# Patient Record
Sex: Male | Born: 1955 | Race: White | Hispanic: Yes | Marital: Married | Smoking: Never smoker
Health system: Southern US, Community
[De-identification: ages and names within clinical notes are randomized; demographics above are authoritative.]

## PROBLEM LIST (undated history)

## (undated) DIAGNOSIS — I10 Essential (primary) hypertension: Secondary | ICD-10-CM

## (undated) DIAGNOSIS — E785 Hyperlipidemia, unspecified: Secondary | ICD-10-CM

## (undated) HISTORY — PX: NO PAST SURGERIES: SHX2092

---

## 2009-01-14 ENCOUNTER — Ambulatory Visit: Payer: Self-pay | Admitting: Gastroenterology

## 2011-03-06 ENCOUNTER — Emergency Department: Payer: Self-pay | Admitting: Emergency Medicine

## 2016-03-22 ENCOUNTER — Other Ambulatory Visit (INDEPENDENT_AMBULATORY_CARE_PROVIDER_SITE_OTHER): Payer: Self-pay | Admitting: Vascular Surgery

## 2016-03-22 DIAGNOSIS — M79662 Pain in left lower leg: Secondary | ICD-10-CM

## 2016-03-22 DIAGNOSIS — M7989 Other specified soft tissue disorders: Secondary | ICD-10-CM

## 2016-03-22 DIAGNOSIS — M79661 Pain in right lower leg: Secondary | ICD-10-CM

## 2016-03-22 DIAGNOSIS — I83813 Varicose veins of bilateral lower extremities with pain: Secondary | ICD-10-CM

## 2016-03-23 ENCOUNTER — Ambulatory Visit (INDEPENDENT_AMBULATORY_CARE_PROVIDER_SITE_OTHER): Payer: 59

## 2016-03-23 DIAGNOSIS — M7989 Other specified soft tissue disorders: Secondary | ICD-10-CM | POA: Diagnosis not present

## 2016-03-23 DIAGNOSIS — I83813 Varicose veins of bilateral lower extremities with pain: Secondary | ICD-10-CM

## 2016-03-23 DIAGNOSIS — M79662 Pain in left lower leg: Secondary | ICD-10-CM | POA: Diagnosis not present

## 2016-03-23 DIAGNOSIS — M79661 Pain in right lower leg: Secondary | ICD-10-CM

## 2016-03-28 ENCOUNTER — Telehealth (INDEPENDENT_AMBULATORY_CARE_PROVIDER_SITE_OTHER): Payer: Self-pay | Admitting: Vascular Surgery

## 2016-03-28 NOTE — Telephone Encounter (Signed)
Second attempt to call patient and discuss his 03/23/16 duplex results. Left message again.

## 2017-05-27 ENCOUNTER — Emergency Department
Admission: EM | Admit: 2017-05-27 | Discharge: 2017-05-27 | Disposition: A | Discharge status: Home / Self Care | Payer: BLUE CROSS/BLUE SHIELD | Attending: Emergency Medicine | Admitting: Emergency Medicine

## 2017-05-27 DIAGNOSIS — I10 Essential (primary) hypertension: Secondary | ICD-10-CM | POA: Insufficient documentation

## 2017-05-27 DIAGNOSIS — I83892 Varicose veins of left lower extremities with other complications: Secondary | ICD-10-CM | POA: Diagnosis not present

## 2017-05-27 DIAGNOSIS — Z79899 Other long term (current) drug therapy: Secondary | ICD-10-CM | POA: Insufficient documentation

## 2017-05-27 HISTORY — DX: Essential (primary) hypertension: I10

## 2017-05-27 NOTE — ED Triage Notes (Signed)
Pt reports that varicose vein in L foot "popped" and bled for approx 2 hours.  Bleeding controlled at this time.  Pt wrapped his own foot.  History of the same.  Pt is A&Ox4, in NAD at this time.

## 2017-05-27 NOTE — ED Provider Notes (Signed)
[facility] Emergency Department Provider Note  ____________________________________________  Time seen: Approximately 9:51 PM  I have reviewed the triage vital signs and the nursing notes.   HISTORY  Chief Complaint Leg Injury (busted varicose vein in L foot)    HPI Douglas Mullins is a 62 y.o. male who presents the emergency department complaining of bleeding varicose vein to the left foot.  Patient reports that he has significant varicose veins to the left lower extremity and moderate varicose veins to the right lower extremity.  Patient is unsure how the bleeding started but states that he had difficulty controlling the bleeding for approximately 2 hours.  Patient states that the bleeding is now well controlled and he has the laceration bandage prior to arrival.  Patient states that he was seen by vascular surgery 1-2 years ago but did not follow-up.  Patient denies any calf pain, swelling, numbness and tingling of the foot.  Patient states that spontaneous bleeding from varicose veins has happened once in the past.  That is a remote history.  No medications for this complaint prior to arrival.  No other injuries or complaints at this time.  Past Medical History:  Diagnosis Date  . Hypertension     There are no active problems to display for this patient.   History reviewed. No pertinent surgical history.  Prior to Admission medications   Medication Sig Start Date End Date Taking? Authorizing Provider  lisinopril (PRINIVIL,ZESTRIL) 10 MG tablet Take 10 mg by mouth daily.   Yes [provider]    Allergies Patient has no known allergies.  No family history on file.  Social History Social History   Tobacco Use  . Smoking status: Never Smoker  Substance Use Topics  . Alcohol use: Yes    Comment: occasional  . Drug use: No     Review of Systems  Constitutional: No fever/chills Cardiovascular: no chest pain. Respiratory: no cough. No  SOB. Gastrointestinal: No abdominal pain.  No nausea, no vomiting.   Musculoskeletal: Negative for musculoskeletal pain. Skin: Negative for rash, abrasions, lacerations, ecchymosis.  Positive for bleeding varicose vein to the left medial foot Neurological: Negative for headaches, focal weakness or numbness. 10-point ROS otherwise negative.  ____________________________________________   PHYSICAL EXAM:  VITAL SIGNS: ED Triage Vitals [05/27/17 2130]  Enc Vitals Group     BP (!) 181/85     Pulse Rate 93     Resp 18     Temp 97.9 F (36.6 C)     Temp Source Oral     SpO2 97 %     Weight 179 lb (81.2 kg)     Height      Head Circumference      Peak Flow      Pain Score 8     Pain Loc      Pain Edu?      Excl. in GC?      Constitutional: Alert and oriented. Well appearing and in no acute distress. Eyes: Conjunctivae are normal. PERRL. EOMI. Head: Atraumatic. Neck: No stridor.    Cardiovascular: Normal rate, regular rhythm. Normal S1 and S2.  Good peripheral circulation. Respiratory: Normal respiratory effort without tachypnea or retractions. Lungs CTAB. Good air entry to the bases with no decreased or absent breath sounds. Musculoskeletal: Full range of motion to all extremities. No gross deformities appreciated. Neurologic:  Normal speech and language. No gross focal neurologic deficits are appreciated.  Skin:  Skin is warm, dry and intact. No  rash noted.  Small wound noted to the left medial foot consistent with a bleeding varicose vein.  At this time, no bleeding.  Area was dressed with no bleeding through the bandage.  Dorsalis pedis pulse intact.  Sensation intact all 5 feet.  Patient has significant varicose veins to this left lower extremity. Psychiatric: Mood and affect are normal. Speech and behavior are normal. Patient exhibits appropriate insight and judgement.   ____________________________________________   LABS (all labs ordered are listed, but only abnormal  results are displayed)  Labs Reviewed - No data to display ____________________________________________  EKG   ____________________________________________  RADIOLOGY   No results found.  ____________________________________________    PROCEDURES  Procedure(s) performed:    Marland KitchenMarland KitchenLaceration Repair Date/Time: 05/27/2017 10:15 PM Performed by: Racheal Patches, PA-C Authorized by: Racheal Patches, PA-C   Consent:    Consent obtained:  Verbal   Consent given by:  Patient   Risks discussed:  Poor wound healing Anesthesia (see MAR for exact dosages):    Anesthesia method:  None Laceration details:    Location:  Foot   Foot location:  Top of L foot Exploration:    Hemostasis achieved with:  Direct pressure   Wound exploration: entire depth of wound probed and visualized     Wound extent: no foreign bodies/material noted and no vascular damage noted     Contaminated: no   Treatment:    Area cleansed with:  Shur-Clens   Amount of cleaning:  Standard   Irrigation solution:  Sterile saline Skin repair:    Repair method:  Tissue adhesive Post-procedure details:    Dressing:  Bulky dressing   Patient tolerance of procedure:  Tolerated well, no immediate complications      Medications - No data to display   ____________________________________________   INITIAL IMPRESSION / ASSESSMENT AND PLAN / ED COURSE  Pertinent labs & imaging results that were available during my care of the patient were reviewed by me and considered in my medical decision making (see chart for details).  Review of the [place] CSRS was performed in accordance of the [facility] prior to dispensing any controlled drugs.     Patient's diagnosis is consistent with bleeding varicose vein to the left foot.  Bleeding had stopped on arrival.  At this time, area was thoroughly cleansed, covered in Dermabond, pressure dressing applied.  Wound care instructions provided to patient.  Patient is to  follow-up with vascular surgery for further management of significant varicose veins to the left lower extremity.  At this time, no indication for further workup.  No prescriptions at this time.   Patient is given ED precautions to return to the ED for any worsening or new symptoms.     ____________________________________________  FINAL CLINICAL IMPRESSION(S) / ED DIAGNOSES  Final diagnoses:  Bleeding from varicose veins of left lower extremity      NEW MEDICATIONS STARTED DURING THIS VISIT:  ED Discharge Orders    None          This chart was dictated using voice recognition software/Dragon. Despite best efforts to proofread, errors can occur which can change the meaning. Any change was purely unintentional.    Racheal Patches, PA-C 05/27/17 2218    Sharman Cheek, MD 05/27/17 [phone]

## 2017-06-12 ENCOUNTER — Encounter (INDEPENDENT_AMBULATORY_CARE_PROVIDER_SITE_OTHER): Payer: Self-pay | Admitting: Vascular Surgery

## 2017-06-12 ENCOUNTER — Ambulatory Visit (INDEPENDENT_AMBULATORY_CARE_PROVIDER_SITE_OTHER): Payer: BLUE CROSS/BLUE SHIELD | Admitting: Vascular Surgery

## 2017-06-12 DIAGNOSIS — M79605 Pain in left leg: Secondary | ICD-10-CM | POA: Diagnosis not present

## 2017-06-12 DIAGNOSIS — I872 Venous insufficiency (chronic) (peripheral): Secondary | ICD-10-CM

## 2017-06-12 DIAGNOSIS — I83813 Varicose veins of bilateral lower extremities with pain: Secondary | ICD-10-CM | POA: Diagnosis not present

## 2017-06-12 DIAGNOSIS — M79604 Pain in right leg: Secondary | ICD-10-CM

## 2017-06-13 ENCOUNTER — Ambulatory Visit (INDEPENDENT_AMBULATORY_CARE_PROVIDER_SITE_OTHER): Payer: BLUE CROSS/BLUE SHIELD

## 2017-06-13 ENCOUNTER — Other Ambulatory Visit (INDEPENDENT_AMBULATORY_CARE_PROVIDER_SITE_OTHER): Payer: Self-pay | Admitting: Vascular Surgery

## 2017-06-13 DIAGNOSIS — I83892 Varicose veins of left lower extremities with other complications: Secondary | ICD-10-CM

## 2017-06-18 ENCOUNTER — Encounter (INDEPENDENT_AMBULATORY_CARE_PROVIDER_SITE_OTHER): Payer: Self-pay | Admitting: Vascular Surgery

## 2017-06-18 DIAGNOSIS — I872 Venous insufficiency (chronic) (peripheral): Secondary | ICD-10-CM | POA: Insufficient documentation

## 2017-06-18 DIAGNOSIS — M79606 Pain in leg, unspecified: Secondary | ICD-10-CM | POA: Insufficient documentation

## 2017-06-18 DIAGNOSIS — I83813 Varicose veins of bilateral lower extremities with pain: Secondary | ICD-10-CM | POA: Insufficient documentation

## 2017-06-18 NOTE — Progress Notes (Signed)
MRN : [id number]  Douglas Mullins is a 62 y.o. (10/03/1955) male who presents with chief complaint of  Chief Complaint  Patient presents with  . Follow-up  .  History of Present Illness: The patient returns for followup evaluation 3 months after the initial visit. The patient continues to have pain in the lower extremities with dependency. The pain is lessened with elevation. Graduated compression stockings, Class I (20-30 mmHg), have been worn but the stockings do not eliminate the leg pain. Over-the-counter analgesics do not improve the symptoms. The degree of discomfort continues to interfere with daily activities. The patient notes the pain in the legs is causing problems with daily exercise, at the workplace and even with household activities and maintenance such as standing in the kitchen preparing meals and doing dishes.   Past Venous ultrasound shows normal deep venous system, no evidence of acute or chronic DVT.  Superficial reflux is present in the bilateral GSV  Current Meds  Medication Sig  . lisinopril (PRINIVIL,ZESTRIL) 10 MG tablet Take 10 mg by mouth daily.    Past Medical History:  Diagnosis Date  . Hypertension     Past Surgical History:  Procedure Laterality Date  . NO PAST SURGERIES      Social History Social History   Tobacco Use  . Smoking status: Never Smoker  . Smokeless tobacco: Never Used  Substance Use Topics  . Alcohol use: Yes    Comment: occasional  . Drug use: No    Family History Family History  Problem Relation Age of Onset  . Varicose Veins Sister     No Known Allergies   REVIEW OF SYSTEMS (Negative unless checked)  Constitutional: [] Weight loss  [] Fever  [] Chills Cardiac: [] Chest pain   [] Chest pressure   [] Palpitations   [] Shortness of breath when laying flat   [] Shortness of breath with exertion. Vascular:  [] Pain in legs with walking   [x] Pain in legs at rest  [] History of DVT   [] Phlebitis   [x] Swelling in legs   [x] Varicose  veins   [] Non-healing ulcers Pulmonary:   [] Uses home oxygen   [] Productive cough   [] Hemoptysis   [] Wheeze  [] COPD   [] Asthma Neurologic:  [] Dizziness   [] Seizures   [] History of stroke   [] History of TIA  [] Aphasia   [] Vissual changes   [] Weakness or numbness in arm   [] Weakness or numbness in leg Musculoskeletal:   [] Joint swelling   [] Joint pain   [] Low back pain Hematologic:  [] Easy bruising  [] Easy bleeding   [] Hypercoagulable state   [] Anemic Gastrointestinal:  [] Diarrhea   [] Vomiting  [] Gastroesophageal reflux/heartburn   [] Difficulty swallowing. Genitourinary:  [] Chronic kidney disease   [] Difficult urination  [] Frequent urination   [] Blood in urine Skin:  [] Rashes   [] Ulcers  Psychological:  [] History of anxiety   []  History of major depression.  Physical Examination  Vitals:   06/12/17 1034  BP: (!) 144/81  Pulse: 70  Resp: 16  Weight: 192 lb 12.8 oz (87.5 kg)  Height: 5' 6.5" (1.689 m)   Body mass index is 30.65 kg/m. Gen: WD/WN, NAD Head: [place]/AT, No temporalis wasting.  Ear/Nose/Throat: Hearing grossly intact, nares w/o erythema or drainage Eyes: PER, EOMI, sclera nonicteric.  Neck: Supple, no large masses.   Pulmonary:  Good air movement, no audible wheezing bilaterally, no use of accessory muscles.  Cardiac: RRR, no JVD Vascular: Large varicosities present extensively greater than 10 mm bilateral.  Mild venous stasis changes to the legs bilaterally.  2+ soft pitting edema Vessel Right Left  Radial Palpable Palpable  PT Palpable Palpable  DP Palpable Palpable  Gastrointestinal: Non-distended. No guarding/no peritoneal signs.  Musculoskeletal: M/S 5/5 throughout.  No deformity or atrophy.  Neurologic: CN 2-12 intact. Symmetrical.  Speech is fluent. Motor exam as listed above. Psychiatric: Judgment intact, Mood & affect appropriate for pt's clinical situation. Dermatologic: Venous rashes no ulcers noted.  No changes consistent with cellulitis. Lymph : No  lichenification or skin changes of chronic lymphedema.  CBC No results found for: WBC, HGB, HCT, MCV, PLT  BMET No results found for: NA, K, CL, CO2, GLUCOSE, BUN, CREATININE, CALCIUM, GFRNONAA, GFRAA CrCl cannot be calculated (No order found.).  COAG No results found for: INR, PROTIME  Radiology No results found.  Assessment/Plan 1. Varicose veins of both lower extremities with pain  Recommend:  The patient has large symptomatic varicose veins that are painful and associated with swelling.  I have had a long discussion with the patient regarding  varicose veins and why they cause symptoms.  Patient will begin wearing graduated compression stockings class 1 on a daily basis, beginning first thing in the morning and removing them in the evening. The patient is instructed specifically not to sleep in the stockings.    The patient  will also begin using over-the-counter analgesics such as Motrin 600 mg po TID to help control the symptoms.    In addition, behavioral modification including elevation during the day will be initiated.    Pending the results of these changes the  patient will be reevaluated in three months.   An  ultrasound of the venous system will be obtained.   Further plans will be based on the ultrasound results and whether conservative therapies are successful at eliminating the pain and swelling.   - VAS [place] LOWER EXTREMITY VENOUS REFLUX; Future  2. Chronic venous insufficiency No surgery or intervention at this point in time.    I have had a long discussion with the patient regarding venous insufficiency and why it  causes symptoms. I have discussed with the patient the chronic skin changes that accompany venous insufficiency and the long term sequela such as infection and ulceration.  Patient will begin wearing graduated compression stockings class 1 (20-30 mmHg) or compression wraps on a daily basis a prescription was given. The patient will put the stockings on  first thing in the morning and removing them in the evening. The patient is instructed specifically not to sleep in the stockings.    In addition, behavioral modification including several periods of elevation of the lower extremities during the day will be continued. I have demonstrated that proper elevation is a position with the ankles at heart level.  The patient is instructed to begin routine exercise, especially walking on a daily basis  Patient should undergo duplex ultrasound of the venous system to ensure that DVT or reflux is not present.  Following the review of the ultrasound the patient will follow up in 2-3 months to reassess the degree of swelling and the control that graduated compression stockings or compression wraps  is offering.   The patient can be assessed for a Lymph Pump at that time  3. Pain in both lower extremities See #1&2    Levora Dredge, MD  06/18/2017 8:56 PM

## 2017-08-21 ENCOUNTER — Telehealth (INDEPENDENT_AMBULATORY_CARE_PROVIDER_SITE_OTHER): Payer: Self-pay | Admitting: Vascular Surgery

## 2021-03-25 ENCOUNTER — Ambulatory Visit
Admission: RE | Admit: 2021-03-25 | Discharge: 2021-03-25 | Disposition: A | Discharge status: Home / Self Care | Payer: BC Managed Care – PPO | Source: Ambulatory Visit | Attending: Family Medicine | Admitting: Family Medicine

## 2021-03-25 ENCOUNTER — Other Ambulatory Visit: Payer: Self-pay

## 2021-03-25 ENCOUNTER — Ambulatory Visit
Admission: RE | Admit: 2021-03-25 | Discharge: 2021-03-25 | Disposition: A | Discharge status: Home / Self Care | Payer: BC Managed Care – PPO | Attending: Family Medicine | Admitting: Family Medicine

## 2021-03-25 ENCOUNTER — Other Ambulatory Visit: Payer: Self-pay | Admitting: Family Medicine

## 2021-03-25 DIAGNOSIS — R52 Pain, unspecified: Secondary | ICD-10-CM

## 2022-09-02 IMAGING — CR DG KNEE 3 VIEWS*L*
3 series · 3 of 3 positions shown · non-contrast
Comparison: None.

CLINICAL DATA: Acute left knee pain without known injury.

EXAM:
LEFT KNEE - 3 VIEW

[knee ap]
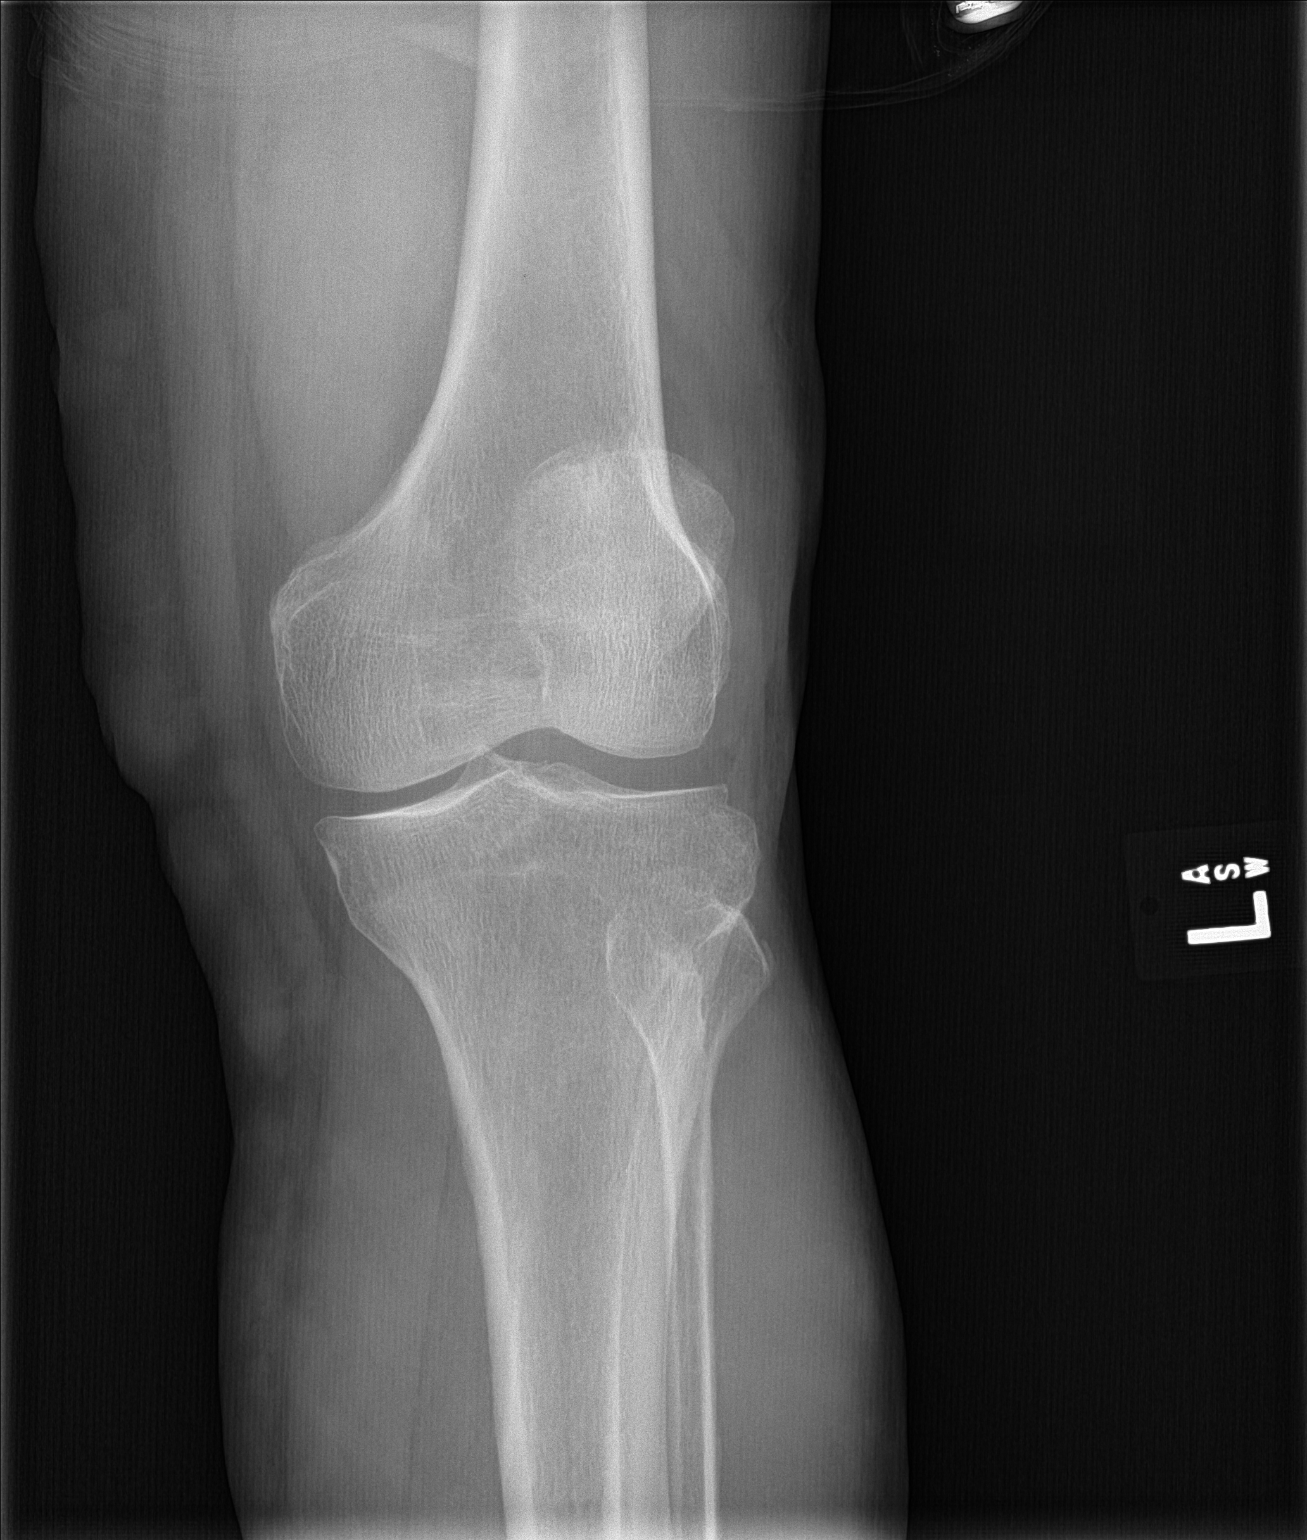

[knee lat]
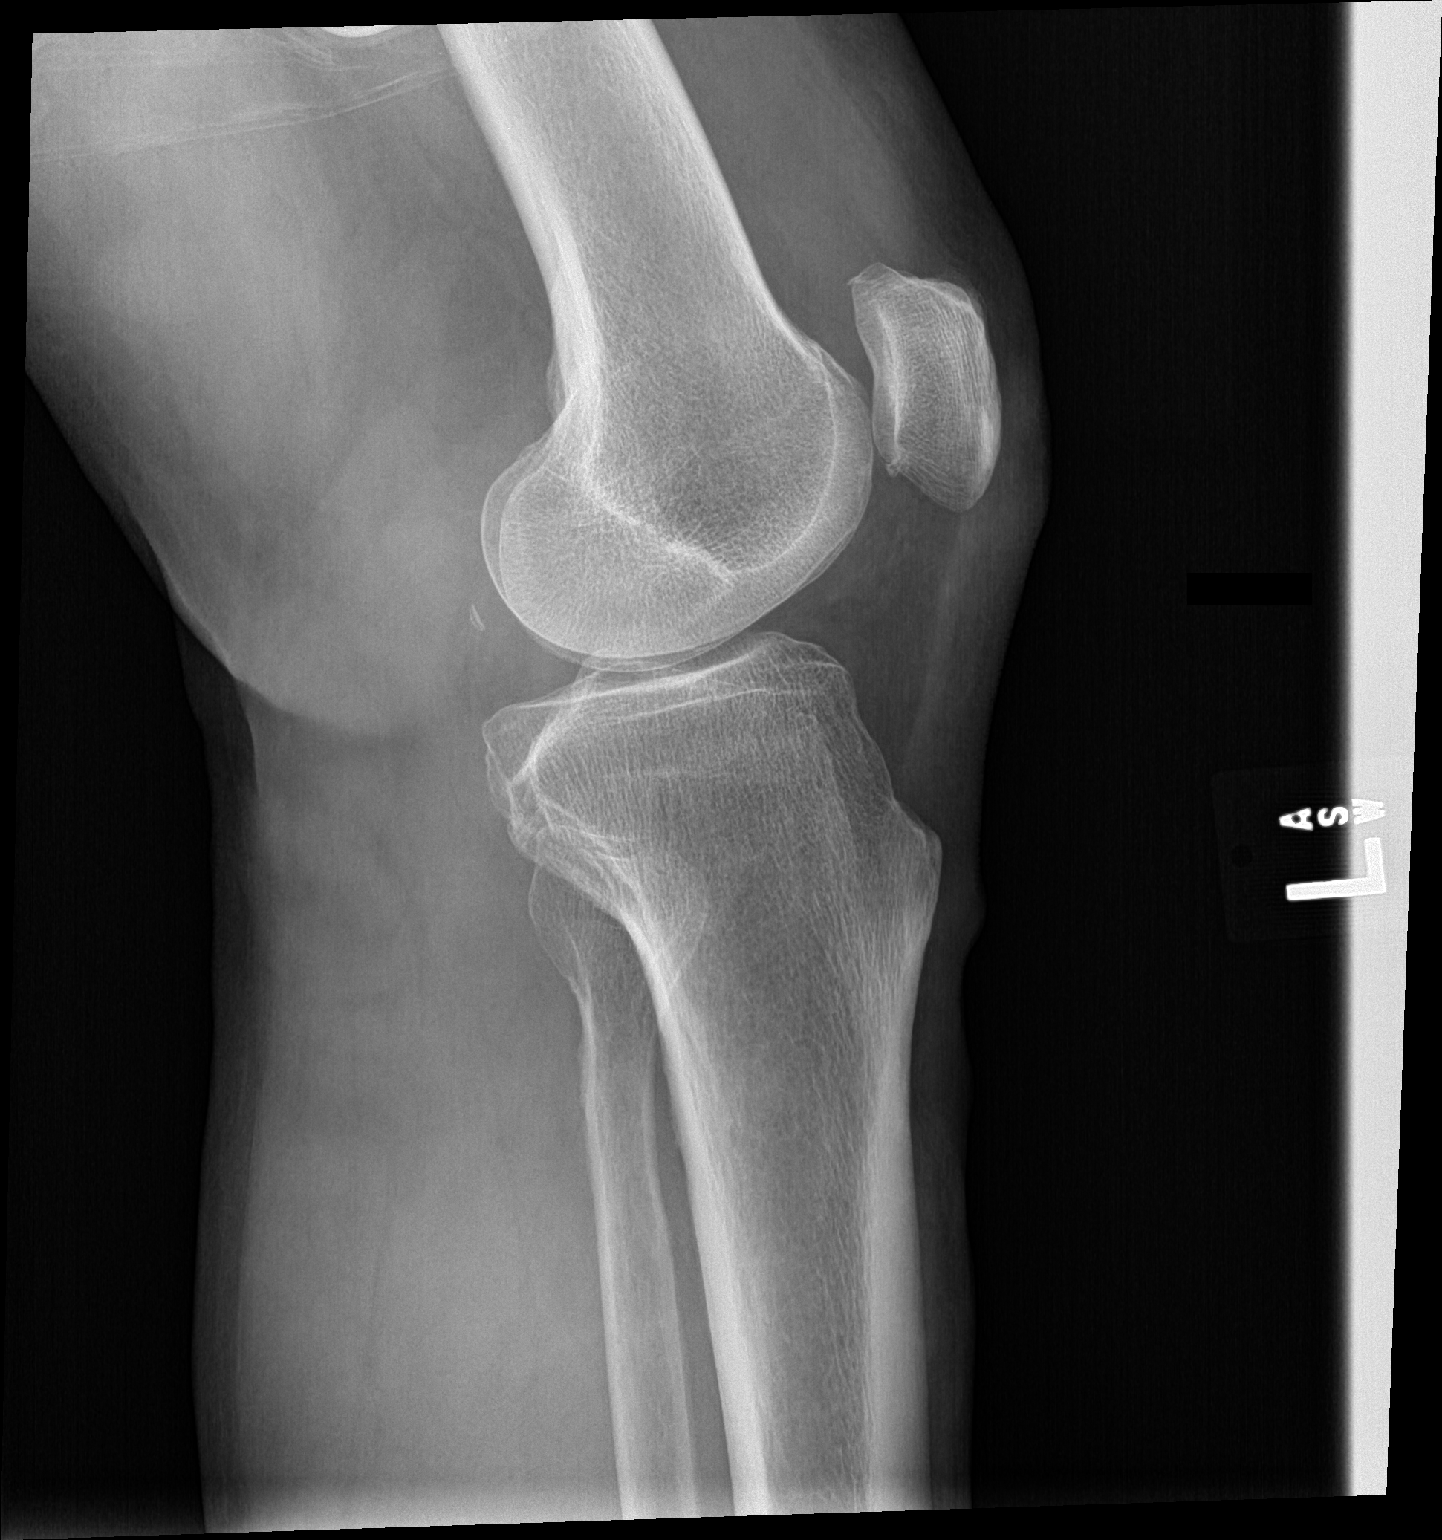

[patella skyline]
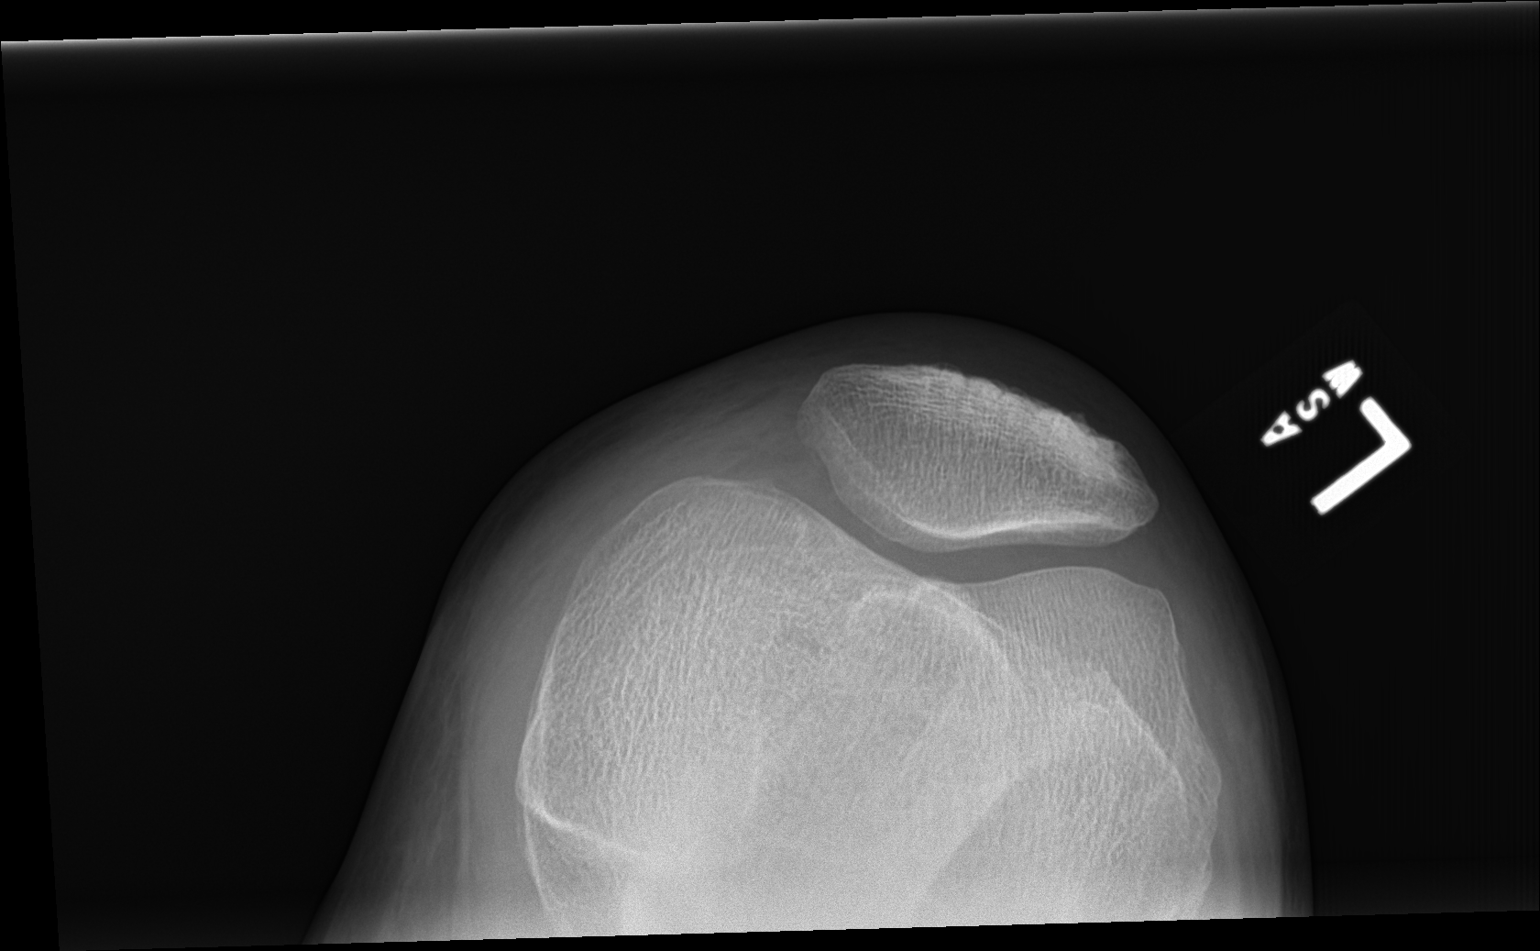

[3 of 3 positions shown; findings below may reference images not displayed]

FINDINGS: No evidence of fracture, dislocation, or joint effusion. Mild
narrowing of medial joint space is noted. Soft tissues are
unremarkable.
IMPRESSION: Mild degenerative joint disease is noted medially. No acute
abnormality seen.

## 2022-12-06 ENCOUNTER — Ambulatory Visit
Admission: EM | Admit: 2022-12-06 | Discharge: 2022-12-06 | Disposition: A | Discharge status: Home / Self Care | Payer: BC Managed Care – PPO

## 2022-12-06 DIAGNOSIS — A059 Bacterial foodborne intoxication, unspecified: Secondary | ICD-10-CM

## 2022-12-06 HISTORY — DX: Hyperlipidemia, unspecified: E78.5

## 2022-12-06 MED ORDER — ONDANSETRON 8 MG PO TBDP: 8.0000 mg | ORAL_TABLET | Freq: Three times a day (TID) | ORAL | 0 refills | Status: DC | PRN

## 2022-12-06 NOTE — ED Provider Notes (Signed)
[facility]    CSN: [id number] Arrival date & time: 12/06/22  1140      History   Chief Complaint Chief Complaint  Patient presents with   Abdominal Pain   Nausea    HPI Douglas Mullins is a 67 y.o. male.   HPI  67 year old male with past medical history significant for hypertension presents for evaluation of nausea, vomiting, and diarrhea that started yesterday after he ate some potato salad.  He reports that when he smelled the potato salad it did not smell right and after he ate a couple bites his symptoms began.  He has been able to drink fluids but has not been able to keep down much.  He did have several episodes of diarrhea but that has resolved.  He denies fever or abdominal pain.  Past Medical History:  Diagnosis Date   Hyperlipidemia    Hypertension     Patient Active Problem List   Diagnosis Date Noted   Varicose veins of both lower extremities with pain 06/18/2017   Chronic venous insufficiency 06/18/2017   Leg pain 06/18/2017    Past Surgical History:  Procedure Laterality Date   NO PAST SURGERIES         Home Medications    Prior to Admission medications   Medication Sig Start Date End Date Taking? Authorizing Provider  lisinopril (PRINIVIL,ZESTRIL) 10 MG tablet Take 10 mg by mouth daily.   Yes [provider]  ondansetron (ZOFRAN-ODT) 8 MG disintegrating tablet Take 1 tablet (8 mg total) by mouth every 8 (eight) hours as needed for nausea or vomiting. 12/06/22  Yes Becky Augusta, NP  atorvastatin (LIPITOR) 40 MG tablet Take 40 mg by mouth daily.    [provider]  tamsulosin (FLOMAX) 0.4 MG CAPS capsule Take by mouth daily.    [provider]    Family History Family History  Problem Relation Age of Onset   Varicose Veins Sister     Social History Social History   Tobacco Use   Smoking status: Never   Smokeless tobacco: Never  Substance Use Topics   Alcohol use: Yes    Comment: occasional   Drug  use: No     Allergies   Patient has no known allergies.   Review of Systems Review of Systems  Constitutional:  Negative for fever.  Gastrointestinal:  Positive for diarrhea, nausea and vomiting. Negative for abdominal pain and blood in stool.     Physical Exam Triage Vital Signs ED Triage Vitals  Encounter Vitals Group     BP      Systolic BP Percentile      Diastolic BP Percentile      Pulse      Resp      Temp      Temp src      SpO2      Weight      Height      Head Circumference      Peak Flow      Pain Score      Pain Loc      Pain Education      Exclude from Growth Chart    No data found.  Updated Vital Signs BP (!) 143/78 (BP Location: Left Arm)   Pulse (!) 108   Temp 98.8 F (37.1 C) (Oral)   SpO2 95%   Visual Acuity Right Eye Distance:   Left Eye Distance:   Bilateral Distance:    Right Eye Near:  Left Eye Near:    Bilateral Near:     Physical Exam Vitals and nursing note reviewed.  Constitutional:      Appearance: Normal appearance. He is not ill-appearing.  HENT:     Head: Normocephalic and atraumatic.  Cardiovascular:     Rate and Rhythm: Normal rate and regular rhythm.     Pulses: Normal pulses.     Heart sounds: Normal heart sounds. No murmur heard.    No friction rub. No gallop.  Pulmonary:     Effort: Pulmonary effort is normal.     Breath sounds: Normal breath sounds. No wheezing, rhonchi or rales.  Abdominal:     General: Abdomen is flat.     Palpations: Abdomen is soft.     Tenderness: There is no abdominal tenderness. There is no guarding or rebound.  Skin:    General: Skin is warm and dry.     Capillary Refill: Capillary refill takes less than 2 seconds.  Neurological:     General: No focal deficit present.     Mental Status: He is alert and oriented to person, place, and time.      UC Treatments / Results  Labs (all labs ordered are listed, but only abnormal results are displayed) Labs Reviewed - No data to  display  EKG   Radiology No results found.  Procedures Procedures (including critical care time)  Medications Ordered in UC Medications - No data to display  Initial Impression / Assessment and Plan / UC Course  I have reviewed the triage vital signs and the nursing notes.  Pertinent labs & imaging results that were available during my care of the patient were reviewed by me and considered in my medical decision making (see chart for details).   Patient is a pleasant, nontoxic-appearing 67 year old male presenting for evaluation of nausea vomiting diarrhea that started after he ate some potato salad that smelled bad yesterday.  He denies any abdominal pain and his abdomen is soft and nontender.  He has been able to drink fluids but he states that when he drinks anything or tries to eat he does experience vomiting.  I suspect that the patient has food poisoning.  I will prescribe him some Zofran that he can use every 8 hours for nausea and vomiting.  He should try a clear liquid diet for the next 6 to 12 hours and then advance to bland foods.  If he develops any abdominal pain, fever, or vomiting and cannot keep down fluids he should return for reevaluation.   Final Clinical Impressions(s) / UC Diagnoses   Final diagnoses:  Food poisoning     Discharge Instructions      Take the Zofran every 8 hours as needed for nausea and vomiting.  They are an oral disintegrating tablet and you can place them on her under your tongue and then will be absorbed.  Follow a clear liquid diet for the next 6 to 12 hours.  Clear liquids consist of broth, ginger ale, water, Pedialyte, and Jell-O.  After 6 to 12 hours, if you are tolerating clear liquids, you can advance to bland foods such as bananas, rice, applesauce, and toast.  If you tolerate bland foods you can continue to advance your diet as you see fit.  If you develop a fever over 100.5, increased abdominal pain, bloody vomit, or bloody stool  return for reevaluation or go to the ER.      ED Prescriptions     Medication Sig  Dispense Auth. Provider   ondansetron (ZOFRAN-ODT) 8 MG disintegrating tablet Take 1 tablet (8 mg total) by mouth every 8 (eight) hours as needed for nausea or vomiting. 20 tablet Becky Augusta, NP      PDMP not reviewed this encounter.   Becky Augusta, NP 12/06/22 [phone]

## 2022-12-06 NOTE — ED Triage Notes (Signed)
Pt presents to UC for abdominal pain onset yesterday after eating, pt states he threw up afterwards. Pt states he did have some diarrhea but has subsided, pt states he did have some take out food yesterday.

## 2022-12-06 NOTE — Discharge Instructions (Signed)
Take the Zofran every 8 hours as needed for nausea and vomiting.  They are an oral disintegrating tablet and you can place them on her under your tongue and then will be absorbed. ° °Follow a clear liquid diet for the next 6 to 12 hours.  Clear liquids consist of broth, ginger ale, water, Pedialyte, and Jell-O. ° °After 6 to 12 hours, if you are tolerating clear liquids, you can advance to bland foods such as bananas, rice, applesauce, and toast.  If you tolerate bland foods you can continue to advance your diet as you see fit. ° °If you develop a fever over 100.5, increased abdominal pain, bloody vomit, or bloody stool return for reevaluation or go to the ER.  °

## 2023-04-22 ENCOUNTER — Ambulatory Visit
Admission: EM | Admit: 2023-04-22 | Discharge: 2023-04-22 | Disposition: A | Discharge status: Home / Self Care | Payer: BC Managed Care – PPO | Attending: Family Medicine | Admitting: Family Medicine

## 2023-04-22 ENCOUNTER — Encounter: Payer: Self-pay | Admitting: Emergency Medicine

## 2023-04-22 DIAGNOSIS — J029 Acute pharyngitis, unspecified: Secondary | ICD-10-CM | POA: Diagnosis not present

## 2023-04-22 LAB — GROUP A STREP BY PCR: Group A Strep by PCR: NOT DETECTED

## 2023-04-22 MED ORDER — PROMETHAZINE-DM 6.25-15 MG/5ML PO SYRP: 5.0000 mL | ORAL_SOLUTION | Freq: Four times a day (QID) | ORAL | 0 refills | Status: AC | PRN

## 2023-04-22 MED ORDER — CETIRIZINE HCL 10 MG PO TABS: 10.0000 mg | ORAL_TABLET | Freq: Every day | ORAL | 0 refills | Status: AC

## 2023-04-22 NOTE — ED Provider Notes (Signed)
[facility]    CSN: [id number] Arrival date & time: 04/22/23  4098      History   Chief Complaint Chief Complaint  Patient presents with   Cough   Sore Throat    HPI 67 year old male presents for evaluation the above.  5-day history of symptoms.  Reports cough, congestion, sore throat.  Most bothered by sore throat.  Pain 7/10 in severity.  No relieving factors.  Patient hypertensive today.  He states that he did not take his medication last night as he normally does.  He states that he just took his blood pressure medication before he arrived here today.  No chest pain.  No shortness of breath.  Past Medical History:  Diagnosis Date   Hyperlipidemia    Hypertension     Patient Active Problem List   Diagnosis Date Noted   Varicose veins of both lower extremities with pain 06/18/2017   Chronic venous insufficiency 06/18/2017   Leg pain 06/18/2017    Past Surgical History:  Procedure Laterality Date   NO PAST SURGERIES         Home Medications    Prior to Admission medications   Medication Sig Start Date End Date Taking? Authorizing Provider  atorvastatin (LIPITOR) 40 MG tablet Take 40 mg by mouth daily.   Yes [provider]  cetirizine (ZYRTEC ALLERGY) 10 MG tablet Take 1 tablet (10 mg total) by mouth daily. 04/22/23  Yes Clance Baquero G, DO  lisinopril (PRINIVIL,ZESTRIL) 10 MG tablet Take 10 mg by mouth daily.   Yes [provider]  promethazine-dextromethorphan (PROMETHAZINE-DM) 6.25-15 MG/5ML syrup Take 5 mLs by mouth 4 (four) times daily as needed. 04/22/23  Yes Lecretia Buczek G, DO  tamsulosin (FLOMAX) 0.4 MG CAPS capsule Take by mouth daily.    [provider]    Family History Family History  Problem Relation Age of Onset   Varicose Veins Sister     Social History Social History   Tobacco Use   Smoking status: Never   Smokeless tobacco: Never  Vaping Use   Vaping status: Never Used  Substance Use Topics    Alcohol use: Yes    Comment: occasional   Drug use: No     Allergies   Patient has no known allergies.   Review of Systems Review of Systems Per HPI  Physical Exam Triage Vital Signs ED Triage Vitals  Encounter Vitals Group     BP 04/22/23 0926 (!) 186/95     Systolic BP Percentile --      Diastolic BP Percentile --      Pulse Rate 04/22/23 0926 60     Resp 04/22/23 0926 15     Temp 04/22/23 0926 97.7 F (36.5 C)     Temp Source 04/22/23 0926 Oral     SpO2 04/22/23 0926 95 %     Weight 04/22/23 0924 192 lb 14.4 oz (87.5 kg)     Height 04/22/23 0924 5' 6.5" (1.689 m)     Head Circumference --      Peak Flow --      Pain Score 04/22/23 0924 7     Pain Loc --      Pain Education --      Exclude from Growth Chart --    No data found.  Updated Vital Signs BP (!) 186/95 (BP Location: Right Arm)   Pulse 60   Temp 97.7 F (36.5 C) (Oral)   Resp 15   Ht 5'  6.5" (1.689 m)   Wt 87.5 kg   SpO2 95%   BMI 30.67 kg/m   Visual Acuity Right Eye Distance:   Left Eye Distance:   Bilateral Distance:    Right Eye Near:   Left Eye Near:    Bilateral Near:     Physical Exam Constitutional:      General: He is not in acute distress.    Appearance: Normal appearance.  HENT:     Head: Normocephalic and atraumatic.     Mouth/Throat:     Pharynx: Posterior oropharyngeal erythema present.  Eyes:     Conjunctiva/sclera: Conjunctivae normal.  Cardiovascular:     Rate and Rhythm: Normal rate and regular rhythm.  Pulmonary:     Effort: Pulmonary effort is normal.     Breath sounds: Normal breath sounds. No wheezing, rhonchi or rales.  Neurological:     Mental Status: He is alert.    UC Treatments / Results  Labs (all labs ordered are listed, but only abnormal results are displayed) Labs Reviewed  GROUP A STREP BY PCR    EKG   Radiology No results found.  Procedures Procedures (including critical care time)  Medications Ordered in UC Medications - No data  to display  Initial Impression / Assessment and Plan / UC Course  I have reviewed the triage vital signs and the nursing notes.  Pertinent labs & imaging results that were available during my care of the patient were reviewed by me and considered in my medical decision making (see chart for details).    67 year old male presents with viral pharyngitis.  Lungs clear.  Exam benign.  Testing negative.  Zyrtec and Promethazine DM as directed.  Final Clinical Impressions(s) / UC Diagnoses   Final diagnoses:  Viral pharyngitis     Discharge Instructions      Warm salt water gargles.  Medication as directed.   ED Prescriptions     Medication Sig Dispense Auth. Provider   cetirizine (ZYRTEC ALLERGY) 10 MG tablet Take 1 tablet (10 mg total) by mouth daily. 30 tablet Sander Remedios G, DO   promethazine-dextromethorphan (PROMETHAZINE-DM) 6.25-15 MG/5ML syrup Take 5 mLs by mouth 4 (four) times daily as needed. 118 mL Tommie Sams, DO      PDMP not reviewed this encounter.   Tommie Sams, [place] 04/22/23 1023

## 2023-04-22 NOTE — Discharge Instructions (Signed)
Warm salt water gargles.  Medication as directed.

## 2023-04-22 NOTE — ED Triage Notes (Signed)
Patient reports cough, chest congestion and sore throat that started 5 days ago.  Patient denies fevers.

## 2023-07-20 ENCOUNTER — Emergency Department: Payer: Worker's Compensation

## 2023-07-20 ENCOUNTER — Other Ambulatory Visit: Payer: Self-pay

## 2023-07-20 ENCOUNTER — Emergency Department
Admission: EM | Admit: 2023-07-20 | Discharge: 2023-07-20 | Disposition: A | Discharge status: Home / Self Care | Payer: Worker's Compensation | Attending: Emergency Medicine | Admitting: Emergency Medicine

## 2023-07-20 DIAGNOSIS — S6991XA Unspecified injury of right wrist, hand and finger(s), initial encounter: Secondary | ICD-10-CM | POA: Diagnosis present

## 2023-07-20 DIAGNOSIS — Z23 Encounter for immunization: Secondary | ICD-10-CM | POA: Diagnosis not present

## 2023-07-20 DIAGNOSIS — W319XXA Contact with unspecified machinery, initial encounter: Secondary | ICD-10-CM | POA: Insufficient documentation

## 2023-07-20 DIAGNOSIS — S61411A Laceration without foreign body of right hand, initial encounter: Secondary | ICD-10-CM | POA: Insufficient documentation

## 2023-07-20 DIAGNOSIS — Y990 Civilian activity done for income or pay: Secondary | ICD-10-CM | POA: Insufficient documentation

## 2023-07-20 MED ORDER — TETANUS-DIPHTH-ACELL PERTUSSIS 5-2.5-18.5 LF-MCG/0.5 IM SUSY
0.5000 mL | PREFILLED_SYRINGE | Freq: Once | INTRAMUSCULAR | Status: AC
  Administered 2023-07-20: 0.5 mL via INTRAMUSCULAR
  Filled 2023-07-20: qty 0.5

## 2023-07-20 MED ORDER — IBUPROFEN 600 MG PO TABS: 600.0000 mg | ORAL_TABLET | Freq: Three times a day (TID) | ORAL | 0 refills | Status: AC | PRN

## 2023-07-20 MED ORDER — IBUPROFEN 600 MG PO TABS
600.0000 mg | ORAL_TABLET | Freq: Once | ORAL | Status: AC
Start: 2023-07-20 — End: 2023-07-20
  Administered 2023-07-20: 600 mg via ORAL
  Filled 2023-07-20: qty 1

## 2023-07-20 MED ORDER — IBUPROFEN 600 MG PO TABS: 600.0000 mg | ORAL_TABLET | Freq: Three times a day (TID) | ORAL | 0 refills | Status: DC | PRN

## 2023-07-20 MED ORDER — LIDOCAINE HCL (PF) 1 % IJ SOLN
5.0000 mL | Freq: Once | INTRAMUSCULAR | Status: AC
  Administered 2023-07-20: 5 mL
  Filled 2023-07-20: qty 5

## 2023-07-20 NOTE — ED Triage Notes (Signed)
 Pt c/o R hand lac after cutting it on a machine at work around [employer]. Bleeding controlled.

## 2023-07-20 NOTE — Discharge Instructions (Signed)
 You have been diagnosed with laceration of the right hand.  Please do not soak your right hand in water.  You can remove the bandage Saturday.  Please make an appointment with your PCP for removal of the suture on Wednesday.  Please come back to ED or go to your PCP if you have new symptoms or symptoms worsen

## 2023-07-20 NOTE — ED Notes (Signed)
 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 Pt nor supervisor did not arrive with COC form. Ineligibility form given to pt and pt instructed to give form to his supervisor.

## 2023-07-20 NOTE — ED Provider Notes (Addendum)
 [facility] Provider Note    Event Date/Time   First MD Initiated Contact with Patient 07/20/23 2105     (approximate)   History   Laceration   HPI  Douglas Mullins is a 68 y.o. male who presents today with laceration on his right hand at work with a machine.  Patient states having Tdap 3 years ago.      Physical Exam   Triage Vital Signs: ED Triage Vitals  Encounter Vitals Group     BP 07/20/23 1932 136/83     Systolic BP Percentile --      Diastolic BP Percentile --      Pulse Rate 07/20/23 1928 99     Resp 07/20/23 1928 18     Temp 07/20/23 1928 97.9 F (36.6 C)     Temp Source 07/20/23 1928 Oral     SpO2 07/20/23 1928 95 %     Weight 07/20/23 1929 170 lb (77.1 kg)     Height 07/20/23 1929 5\' 7"  (1.702 m)     Head Circumference --      Peak Flow --      Pain Score 07/20/23 1929 6     Pain Loc --      Pain Education --      Exclude from Growth Chart --     Most recent vital signs: Vitals:   07/20/23 1928 07/20/23 1932  BP:  136/83  Pulse: 99   Resp: 18   Temp: 97.9 F (36.6 C)   SpO2: 95%      Constitutional: Alert, NAD. Able to speak in complete sentences without cough or dyspnea  Eyes: Conjunctivae are normal.  Head: Atraumatic. Nose: No congestion/rhinnorhea. Mouth/Throat: Mucous membranes are moist.   Neck: Painless ROM. Supple. No JVD, nodes, thyromegaly  Cardiovascular:   Good peripheral circulation.RRR no murmurs, gallops, rubs  Respiratory: Normal respiratory effort.  No retractions. Clear to auscultation bilaterally without wheezing or crackles  Gastrointestinal: Soft and nontender.  Musculoskeletal:  no deformity Right hand: Laceration in the dorsum of the hand in metacarpophalangeal area of first second finger.  Laceration of 6 cm with active bleeding, presence of foreign bodies, avulsion of tissue full ROM.  Neurologic:  MAE spontaneously. No gross focal neurologic deficits are appreciated.  Skin:  Skin is warm,  dry and intact. No rash noted. Psychiatric: Mood and affect are normal. Speech and behavior are normal.    ED Results / Procedures / Treatments   Labs (all labs ordered are listed, but only abnormal results are displayed) Labs Reviewed - No data to display   EKG     RADIOLOGY I independently reviewed and interpreted imaging and agree with radiologists findings.      PROCEDURES:  Critical Care performed:   .Laceration Repair  Date/Time: 07/20/2023 11:13 PM  Performed by: Gladys Damme, PA-C Authorized by: Gladys Damme, PA-C   Consent:    Consent obtained:  Verbal   Consent given by:  Patient   Risks, benefits, and alternatives were discussed: yes     Risks discussed:  Infection and pain Universal protocol:    Procedure explained and questions answered to patient or proxy's satisfaction: yes     Patient identity confirmed:  Verbally with patient Anesthesia:    Anesthesia method:  Local infiltration   Local anesthetic:  Lidocaine 1% WITH epi Laceration details:    Location:  Hand   Hand location:  R hand, dorsum   Length (cm):  6  Depth (mm):  5 Pre-procedure details:    Preparation:  Patient was prepped and draped in usual sterile fashion Exploration:    Hemostasis achieved with:  Direct pressure Treatment:    Area cleansed with:  Povidone-iodine   Amount of cleaning:  Standard   Irrigation solution:  Sterile saline   Irrigation method:  Pressure wash   Visualized foreign bodies/material removed: no   Skin repair:    Repair method:  Sutures   Suture size:  4-0   Suture material:  Nylon   Suture technique:  Simple interrupted   Number of sutures:  7 Approximation:    Approximation:  Close Repair type:    Repair type:  Simple Post-procedure details:    Dressing:  Sterile dressing   Procedure completion:  Tolerated well, no immediate complications    MEDICATIONS ORDERED IN ED: Medications  Tdap (BOOSTRIX) injection 0.5 mL (has no  administration in time range)  ibuprofen (ADVIL) tablet 600 mg (600 mg Oral Given 07/20/23 2144)  lidocaine (PF) (XYLOCAINE) 1 % injection 5 mL (5 mLs Other Given 07/20/23 2144)      IMPRESSION / MDM / ASSESSMENT AND PLAN / ED COURSE  I reviewed the triage vital signs and the nursing notes.  Differential diagnosis includes, but is not limited to, laceration, avulsion, fracture  Patient's presentation is most consistent with acute complicated illness / injury requiring diagnostic workup.   Patient's diagnosis is consistent with laceration right hand. I independently reviewed and interpreted imaging and agree with radiologists findings.  During admission patient had suturing, ibuprofen and Tdap. I did review the patient's allergies and medications.The patient is in stable and satisfactory condition for discharge home  Patient will be discharged home with prescriptions for Iburofen 600 mg. Patient is to follow up with PCP as needed or otherwise directed. Patient is given ED precautions to return to the ED for any worsening or new symptoms. Discussed plan of care with patient, answered all of patient's questions, Patient agreeable to plan of care. Advised patient to take medications according to the instructions on the label. Discussed possible side effects of new medications. Patient verbalized understanding.    FINAL CLINICAL IMPRESSION(S) / ED DIAGNOSES   Final diagnoses:  Laceration of right hand without foreign body, initial encounter     Rx / DC Orders   ED Discharge Orders          Ordered    ibuprofen (ADVIL) 600 MG tablet  Every 8 hours PRN,   Status:  Discontinued        07/20/23 2129    ibuprofen (ADVIL) 600 MG tablet  Every 8 hours PRN        07/20/23 2238             Note:  This document was prepared using Dragon voice recognition software and may include unintentional dictation errors.   Gladys Damme, PA-C 07/20/23 2240    Concha Se, MD 07/20/23  2249    Gladys Damme, PA-C 07/20/23 2315    Concha Se, MD 07/21/23 Ebony Cargo
